# Patient Record
Sex: Female | Born: 2000
Health system: Southern US, Community
[De-identification: ages and names within clinical notes are randomized; demographics above are authoritative.]

---
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
## 2022-01-13 ENCOUNTER — Ambulatory Visit: Payer: 59 | Admitting: Obstetrics & Gynecology

## 2022-01-26 ENCOUNTER — Encounter: Payer: Self-pay | Admitting: Advanced Practice Midwife

## 2022-01-26 ENCOUNTER — Ambulatory Visit (INDEPENDENT_AMBULATORY_CARE_PROVIDER_SITE_OTHER): Payer: 59 | Admitting: Advanced Practice Midwife

## 2022-01-26 ENCOUNTER — Other Ambulatory Visit: Payer: Self-pay

## 2022-01-26 VITALS — BP 112/61 | HR 72 | Ht <= 58 in | Wt 93.0 lb

## 2022-01-26 DIAGNOSIS — N946 Dysmenorrhea, unspecified: Secondary | ICD-10-CM | POA: Diagnosis not present

## 2022-01-26 DIAGNOSIS — Z01419 Encounter for gynecological examination (general) (routine) without abnormal findings: Secondary | ICD-10-CM

## 2022-01-26 DIAGNOSIS — Z3041 Encounter for surveillance of contraceptive pills: Secondary | ICD-10-CM | POA: Diagnosis not present

## 2022-01-26 NOTE — Progress Notes (Signed)
GYNECOLOGY ANNUAL PREVENTATIVE CARE ENCOUNTER NOTE  Subjective:   Linda Mendoza is a 21 y.o. G0P0000 female here for a routine annual gynecologic exam.  Current complaints: dysmenorrhea, well controlled with OCPs and Ibuprofen.   Denies abnormal vaginal bleeding, discharge, pelvic pain, problems with intercourse or other gynecologic concerns.   States does not want to have a pelvic exam or any testing for STIs.  Discussed risks of STIs and that you can have them without symptoms, she declines   Gynecologic History Patient's last menstrual period was 12/20/2021 (exact date). Contraception: OCP (estrogen/progesterone) Last Pap: never  Too young   Obstetric History OB History  Gravida Para Term Preterm AB Living  0 0 0 0 0 0  SAB IAB Ectopic Multiple Live Births  0 0 0 0 0    No past medical history on file.  No past surgical history on file.  Current Outpatient Medications on File Prior to Visit  Medication Sig Dispense Refill   ferrous sulfate 325 (65 FE) MG tablet Take 1 tablet (325 mg total) by mouth daily with breakfast. 28 tablet 12   ibuprofen (ADVIL) 400 MG tablet Take 1 tablet (400 mg total) by mouth every 6 (six) hours as needed. 30 tablet 4   LO LOESTRIN FE 1 MG-10 MCG / 10 MCG tablet Take 1 tablet by mouth daily. 28 tablet 4   No current facility-administered medications on file prior to visit.    No Known Allergies  Social History   Socioeconomic History   Marital status: Single    Spouse name: Not on file   Number of children: Not on file   Years of education: Not on file   Highest education level: 11th grade  Occupational History   Not on file  Tobacco Use   Smoking status: Never   Smokeless tobacco: Never  Vaping Use   Vaping Use: Never used  Substance and Sexual Activity   Alcohol use: Never   Drug use: Never   Sexual activity: Not Currently    Birth control/protection: None  Other Topics Concern   Not on file  Social History Narrative   Is in  8th grade at [employer].   Social Determinants of Health   Financial Resource Strain: Not on file  Food Insecurity: Not on file  Transportation Needs: Not on file  Physical Activity: Not on file  Stress: Not on file  Social Connections: Not on file  Intimate Partner Violence: Not on file    No family history on file.  The following portions of the patient's history were reviewed and updated as appropriate: allergies, current medications, past family history, past medical history, past social history, past surgical history and problem list.  Review of Systems Pertinent items noted in HPI and remainder of comprehensive ROS otherwise negative.   Objective:  BP 112/61    Pulse 72    Ht 4\' 9"  (1.448 m)    Wt 93 lb (42.2 kg)    LMP 12/20/2021 (Exact Date)    BMI 20.13 kg/m  CONSTITUTIONAL: Well-developed, well-nourished female in no acute distress.  HENT:  Normocephalic, atraumatic, External right and left ear normal. Oropharynx is clear and moist EYES: Conjunctivae and EOM are normal. No scleral icterus.  NECK: Normal range of motion, supple, no masses.  Normal thyroid.  SKIN: Skin is warm and dry. No rash noted. Not diaphoretic. No erythema. No pallor. NEUROLOGIC: Alert and oriented to person, place, and time. Normal reflexes, muscle tone coordination. No cranial nerve  deficit noted. PSYCHIATRIC: Normal mood and affect. Normal behavior. Normal judgment and thought content. CARDIOVASCULAR: Normal heart rate noted, regular rhythm RESPIRATORY: Clear to auscultation bilaterally. Effort and breath sounds normal, no problems with respiration noted. BREASTS: Symmetric in size. No masses, skin changes, nipple drainage, or lymphadenopathy. ABDOMEN: Soft, normal bowel sounds, no distention noted.  No tenderness, rebound or guarding.  PELVIC: Deferred. MUSCULOSKELETAL: Normal range of motion. No tenderness.  No cyanosis, clubbing, or edema.  2+ distal pulses.   Assessment:  Annual  gynecologic examination  Oral contraceptive use Dysmenorrhea Knowledge Deficit regarding female anatomy   Plan:  Will reorder OCPs  Long discussion and demonstration with model regarding anatomy of pelvic organs.  Discussed how pelvic exam and pap are done.  Discussed things we look for on pelvic exam.  Discussed STI testing, declines Breast self exam reviewed Routine preventative health maintenance measures emphasized. Please refer to After Visit Summary for other counseling recommendations.

## 2022-02-24 ENCOUNTER — Other Ambulatory Visit: Payer: Self-pay

## 2022-02-24 DIAGNOSIS — N946 Dysmenorrhea, unspecified: Secondary | ICD-10-CM

## 2022-02-24 MED ORDER — FERROUS SULFATE 325 (65 FE) MG PO TABS: 325.0000 mg | ORAL_TABLET | Freq: Every day | ORAL | 12 refills | Status: AC

## 2022-02-24 MED ORDER — IBUPROFEN 400 MG PO TABS: 400.0000 mg | ORAL_TABLET | Freq: Four times a day (QID) | ORAL | 4 refills | Status: AC | PRN

## 2022-02-24 MED ORDER — LO LOESTRIN FE 1 MG-10 MCG / 10 MCG PO TABS: 1.0000 | ORAL_TABLET | Freq: Every day | ORAL | 4 refills | Status: DC

## 2022-02-24 NOTE — Progress Notes (Signed)
Pt called requesting a refill of 400 mg ibuprofen, Lo Loestrin FE and iron supplements. Ibuprofen, Loestrin, and ibuprofen was sent to her pharmacy.  ?Geneal Huebert l Gabreal Worton, CMA  ?

## 2022-08-30 ENCOUNTER — Ambulatory Visit: Payer: 59

## 2022-09-01 ENCOUNTER — Ambulatory Visit: Payer: 59

## 2022-09-02 ENCOUNTER — Ambulatory Visit: Payer: 59

## 2022-12-07 ENCOUNTER — Ambulatory Visit (INDEPENDENT_AMBULATORY_CARE_PROVIDER_SITE_OTHER): Payer: 59

## 2022-12-07 ENCOUNTER — Other Ambulatory Visit (HOSPITAL_COMMUNITY)
Admission: RE | Admit: 2022-12-07 | Discharge: 2022-12-07 | Disposition: A | Discharge status: Home / Self Care | Payer: BC Managed Care – PPO | Source: Ambulatory Visit | Attending: Family Medicine | Admitting: Family Medicine

## 2022-12-07 VITALS — BP 119/67 | HR 79

## 2022-12-07 DIAGNOSIS — Z113 Encounter for screening for infections with a predominantly sexual mode of transmission: Secondary | ICD-10-CM

## 2022-12-07 DIAGNOSIS — Z202 Contact with and (suspected) exposure to infections with a predominantly sexual mode of transmission: Secondary | ICD-10-CM | POA: Diagnosis not present

## 2022-12-07 NOTE — Progress Notes (Signed)
SUBJECTIVE:  21 y.o. female would like to be tested for STD. Patient states she had an unfaithful partner. Denies abnormal vaginal bleeding or significant pelvic pain or fever. No UTI symptoms. Denies history of known exposure to STD.    OBJECTIVE:  She appears well, afebrile.   ASSESSMENT:  Screening for STDs   PLAN:  GC, chlamydia, trichomonas, BVAG, CVAG probe sent to lab.patient requests additional STD bloodwork to be obtained as well. Treatment: To be determined once lab results are received ROV prn if symptoms persist or worsen.   Armandina Stammer RN

## 2022-12-08 ENCOUNTER — Encounter: Payer: Self-pay | Admitting: Advanced Practice Midwife

## 2022-12-08 LAB — HEPATITIS C ANTIBODY: Hep C Virus Ab: NONREACTIVE

## 2022-12-08 LAB — CERVICOVAGINAL ANCILLARY ONLY
Bacterial Vaginitis (gardnerella): POSITIVE — AB
Candida Glabrata: NEGATIVE
Candida Vaginitis: POSITIVE — AB
Chlamydia: NEGATIVE
Comment: NEGATIVE
Comment: NEGATIVE
Comment: NEGATIVE
Comment: NEGATIVE
Comment: NEGATIVE
Comment: NORMAL
Neisseria Gonorrhea: NEGATIVE
Trichomonas: NEGATIVE

## 2022-12-08 LAB — HEPATITIS B SURFACE ANTIGEN: Hepatitis B Surface Ag: NEGATIVE

## 2022-12-08 LAB — HIV ANTIBODY (ROUTINE TESTING W REFLEX): HIV Screen 4th Generation wRfx: NONREACTIVE

## 2022-12-08 LAB — RPR: RPR Ser Ql: NONREACTIVE

## 2022-12-11 MED ORDER — METRONIDAZOLE 500 MG PO TABS: 500.0000 mg | ORAL_TABLET | Freq: Two times a day (BID) | ORAL | 0 refills | Status: AC

## 2022-12-11 MED ORDER — FLUCONAZOLE 150 MG PO TABS: 150.0000 mg | ORAL_TABLET | Freq: Once | ORAL | 0 refills | Status: AC

## 2022-12-11 NOTE — Addendum Note (Signed)
Addended by: Levie Heritage on: 12/11/2022 11:05 AM   Modules accepted: Orders

## 2022-12-28 ENCOUNTER — Ambulatory Visit: Payer: Self-pay | Admitting: Advanced Practice Midwife

## 2023-01-11 ENCOUNTER — Ambulatory Visit: Payer: Self-pay | Admitting: Advanced Practice Midwife

## 2023-01-24 ENCOUNTER — Other Ambulatory Visit: Payer: Self-pay

## 2023-01-24 DIAGNOSIS — N946 Dysmenorrhea, unspecified: Secondary | ICD-10-CM

## 2023-01-24 MED ORDER — LO LOESTRIN FE 1 MG-10 MCG / 10 MCG PO TABS: 1.0000 | ORAL_TABLET | Freq: Every day | ORAL | 0 refills | Status: DC

## 2023-02-15 ENCOUNTER — Encounter: Payer: Self-pay | Admitting: Advanced Practice Midwife

## 2023-02-15 ENCOUNTER — Ambulatory Visit (INDEPENDENT_AMBULATORY_CARE_PROVIDER_SITE_OTHER): Payer: BC Managed Care – PPO | Admitting: Advanced Practice Midwife

## 2023-02-15 VITALS — BP 100/61 | HR 74 | Ht <= 58 in | Wt 97.0 lb

## 2023-02-15 DIAGNOSIS — Z3041 Encounter for surveillance of contraceptive pills: Secondary | ICD-10-CM | POA: Insufficient documentation

## 2023-02-15 DIAGNOSIS — Z01419 Encounter for gynecological examination (general) (routine) without abnormal findings: Secondary | ICD-10-CM

## 2023-02-15 DIAGNOSIS — N946 Dysmenorrhea, unspecified: Secondary | ICD-10-CM | POA: Diagnosis not present

## 2023-02-15 MED ORDER — LO LOESTRIN FE 1 MG-10 MCG / 10 MCG PO TABS: 1.0000 | ORAL_TABLET | Freq: Every day | ORAL | 11 refills | Status: DC

## 2023-02-15 NOTE — Progress Notes (Signed)
GYNECOLOGY ANNUAL PREVENTATIVE CARE ENCOUNTER NOTE  Subjective:   Linda Mendoza is a 22 y.o. G0P0000 female here for a routine annual gynecologic exam.  Current complaints: dysmenorrhea, better on OCPs  Wants to continue OCPs.  .   Denies abnormal vaginal bleeding, discharge, pelvic pain, problems with intercourse or other gynecologic concerns.   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 Contraception: OCP (estrogen/progesterone) Last Pap: too young.   Obstetric History OB History  Gravida Para Term Preterm AB Living  0 0 0 0 0 0  SAB IAB Ectopic Multiple Live Births  0 0 0 0 0    No past medical history on file.  No past surgical history on file.  Current Outpatient Medications on File Prior to Visit  Medication Sig Dispense Refill   fluconazole (DIFLUCAN) 150 MG tablet Take 150 mg by mouth once.     ferrous sulfate 325 (65 FE) MG tablet Take 1 tablet (325 mg total) by mouth daily with breakfast. 28 tablet 12   ibuprofen (ADVIL) 400 MG tablet Take 1 tablet (400 mg total) by mouth every 6 (six) hours as needed. 30 tablet 4   metroNIDAZOLE (FLAGYL) 500 MG tablet Take 1 tablet (500 mg total) by mouth 2 (two) times daily. 14 tablet 0   No current facility-administered medications on file prior to visit.    No Known Allergies  Social History   Socioeconomic History   Marital status: Single    Spouse name: Not on file   Number of children: Not on file   Years of education: Not on file   Highest education level: 11th grade  Occupational History   Not on file  Tobacco Use   Smoking status: Never   Smokeless tobacco: Never  Vaping Use   Vaping Use: Never used  Substance and Sexual Activity   Alcohol use: Never   Drug use: Never   Sexual activity: Not Currently    Birth control/protection: None  Other Topics Concern   Not on file  Social History  Narrative   Is in 8th grade at [employer].   Social Determinants of Health   Financial Resource Strain: Not on file  Food Insecurity: Not on file  Transportation Needs: Not on file  Physical Activity: Not on file  Stress: Not on file  Social Connections: Not on file  Intimate Partner Violence: Not on file    No family history on file.  The following portions of the patient's history were reviewed and updated as appropriate: allergies, current medications, past family history, past medical history, past social history, past surgical history and problem list.  Review of Systems Pertinent items noted in HPI and remainder of comprehensive ROS otherwise negative.   Objective:  BP 100/61   Pulse 74   Ht '4\' 10"'$  (1.473 m)   Wt 97 lb (44 kg)   LMP 01/26/2023   BMI 20.27 kg/m  CONSTITUTIONAL: Well-developed, well-nourished female in no acute distress.  HENT:  Normocephalic, atraumatic, External right and left ear normal. Oropharynx is clear and moist EYES: Conjunctivae and EOM are normal. No scleral icterus.  NECK: Normal range of motion, supple, no masses.  Normal thyroid.  SKIN: Skin is warm and dry. No rash noted. Not diaphoretic. No erythema. No pallor. NEUROLOGIC: Alert and oriented to person, place, and time. Normal reflexes, muscle tone coordination. No cranial nerve deficit  noted. PSYCHIATRIC: Normal mood and affect. Normal behavior. Normal judgment and thought content. CARDIOVASCULAR: Normal heart rate noted, regular rhythm RESPIRATORY: Clear to auscultation bilaterally. Effort and breath sounds normal, no problems with respiration noted. BREASTS: Symmetric in size. No masses, skin changes, nipple drainage, or lymphadenopathy. ABDOMEN: Soft, normal bowel sounds, no distention noted.  No tenderness, rebound or guarding.  PELVIC: deferred per pt preference MUSCULOSKELETAL: Normal range of motion. No tenderness.  No cyanosis, clubbing, or edema.  2+ distal  pulses.   Assessment:  Annual gynecologic examination Oral contraceptive use Dysmenorrhea   Plan:  Rx Lo Loestrin OCPs x 1 year Reviewed ACHES/signs of VTE Ibuprofen for dysmenorrhea Pap at age 17 Broke up with prior partner, declines STD testing for now Routine preventative health maintenance measures emphasized. Please refer to After Visit Summary for other counseling recommendations.

## 2023-04-01 ENCOUNTER — Encounter: Payer: Self-pay | Admitting: Advanced Practice Midwife

## 2023-04-01 ENCOUNTER — Other Ambulatory Visit: Payer: Self-pay

## 2023-04-01 DIAGNOSIS — N946 Dysmenorrhea, unspecified: Secondary | ICD-10-CM

## 2023-04-01 MED ORDER — LO LOESTRIN FE 1 MG-10 MCG / 10 MCG PO TABS: 1.0000 | ORAL_TABLET | Freq: Every day | ORAL | 11 refills | Status: AC

## 2023-04-26 ENCOUNTER — Other Ambulatory Visit (HOSPITAL_COMMUNITY)
Admission: RE | Admit: 2023-04-26 | Discharge: 2023-04-26 | Disposition: A | Discharge status: Home / Self Care | Payer: BC Managed Care – PPO | Source: Ambulatory Visit | Attending: Student | Admitting: Student

## 2023-04-26 ENCOUNTER — Ambulatory Visit (INDEPENDENT_AMBULATORY_CARE_PROVIDER_SITE_OTHER): Payer: BC Managed Care – PPO

## 2023-04-26 VITALS — BP 96/50 | HR 63 | Ht 59.0 in | Wt 98.0 lb

## 2023-04-26 DIAGNOSIS — Z113 Encounter for screening for infections with a predominantly sexual mode of transmission: Secondary | ICD-10-CM

## 2023-04-26 NOTE — Progress Notes (Signed)
Patient request STD testing.  Patient has not had a known exposure to any STD and not having any symptoms.  Patient denies offer to do bloodwork STD testing. Armandina Stammer RN

## 2023-04-27 LAB — CERVICOVAGINAL ANCILLARY ONLY
Chlamydia: NEGATIVE
Comment: NEGATIVE
Comment: NEGATIVE
Comment: NORMAL
Neisseria Gonorrhea: NEGATIVE
Trichomonas: NEGATIVE

## 2023-07-20 ENCOUNTER — Encounter: Payer: Self-pay | Admitting: Obstetrics & Gynecology

## 2023-07-20 ENCOUNTER — Ambulatory Visit (INDEPENDENT_AMBULATORY_CARE_PROVIDER_SITE_OTHER): Payer: BC Managed Care – PPO | Admitting: Obstetrics & Gynecology

## 2023-07-20 VITALS — BP 96/61 | HR 75 | Ht <= 58 in | Wt 100.0 lb

## 2023-07-20 DIAGNOSIS — Z3009 Encounter for other general counseling and advice on contraception: Secondary | ICD-10-CM | POA: Diagnosis not present

## 2023-07-20 DIAGNOSIS — Z01419 Encounter for gynecological examination (general) (routine) without abnormal findings: Secondary | ICD-10-CM

## 2023-07-20 MED ORDER — MISOPROSTOL 200 MCG PO TABS: ORAL_TABLET | ORAL | 0 refills | Status: DC

## 2023-07-20 NOTE — Progress Notes (Signed)
Patient presents for annual exam. Patient on birth control- LOLoestrin Armandina Stammer RN

## 2023-07-20 NOTE — Patient Instructions (Signed)

## 2023-07-20 NOTE — Progress Notes (Signed)
Subjective:     Linda Mendoza is a 22 y.o. female here for a routine exam.  Current complaints: annual GYN exam and refill of contraception.       Gynecologic History Patient's last menstrual period was 07/10/2023. Contraception: OCP (estrogen/progesterone) Last Pap: n/a.  Last mammogram: n/a.   Obstetric History OB History  Gravida Para Term Preterm AB Living  0 0 0 0 0 0  SAB IAB Ectopic Multiple Live Births  0 0 0 0 0     The following portions of the patient's history were reviewed and updated as appropriate: allergies, current medications, past family history, past medical history, past social history, past surgical history, and problem list.  Review of Systems Pertinent items are noted in HPI.    Objective:  BP 96/61   Pulse 75   Ht 4\' 10"  (1.473 m)   Wt 100 lb (45.4 kg)   LMP 07/10/2023   BMI 20.90 kg/m   General Appearance:    Alert, cooperative, no distress, appears stated age  Head:    Normocephalic, without obvious abnormality, atraumatic  Eyes:    conjunctiva/corneas clear, EOM's intact, both eyes  Ears:    Normal external ear canals, both ears  Nose:   Nares normal, septum midline, mucosa normal, no drainage    or sinus tenderness  Throat:   Lips, mucosa, and tongue normal; teeth and gums normal  Neck:   Supple, symmetrical, trachea midline, no adenopathy;    thyroid:  no enlargement/tenderness/nodules  Back:     Symmetric, no curvature, ROM normal, no CVA tenderness  Lungs:     respirations unlabored  Chest Wall:    No tenderness or deformity   Heart:    Regular rate and rhythm  Breast Exam:    No tenderness, masses, or nipple abnormality  Abdomen:     Soft, non-tender, bowel sounds active all four quadrants,    no masses, no organomegaly  Genitalia:    Normal female without lesion, discharge or tenderness     Extremities:   Extremities normal, atraumatic, no cyanosis or edema  Pulses:   2+ and symmetric all extremities  Skin:   Skin color, texture,  turgor normal, no rashes or lesions     Assessment:    Healthy female exam.  Reviewed all forms of birth control options available including abstinence; over the counter/barrier methods; hormonal contraceptive medication including pill, patch, ring, injection,contraceptive implant; hormonal and nonhormonal IUDs; permanent sterilization options including vasectomy and the various tubal sterilization modalities. Risks and benefits reviewed.  Questions were answered.  Information was given to patient to review.  Pt will f/u for IUD after taking cytotec.    Plan:   Diagnoses and all orders for this visit:  Well female exam with routine gynecological exam  Encounter for counseling regarding contraception -     Discontinue: misoprostol (CYTOTEC) 200 MCG tablet; Place two tablets in the vagina the night prior to your next clinic appointment   F/u in 2 weeks for IUD after Cytotec   Xiao Graul L. Harraway-Smith, M.D., [facility]

## 2023-08-01 ENCOUNTER — Encounter: Payer: Self-pay | Admitting: Obstetrics & Gynecology

## 2023-08-01 ENCOUNTER — Other Ambulatory Visit: Payer: Self-pay

## 2023-08-01 DIAGNOSIS — Z3009 Encounter for other general counseling and advice on contraception: Secondary | ICD-10-CM

## 2023-08-01 MED ORDER — MISOPROSTOL 200 MCG PO TABS: ORAL_TABLET | ORAL | 0 refills | Status: AC

## 2023-08-01 NOTE — Progress Notes (Signed)
Cytotec Rx sent to [facility] on [address] was canceled.  Jaclyn Carew l Rodrigues Urbanek, CMA

## 2023-08-03 ENCOUNTER — Ambulatory Visit (INDEPENDENT_AMBULATORY_CARE_PROVIDER_SITE_OTHER): Payer: BC Managed Care – PPO | Admitting: Obstetrics & Gynecology

## 2023-08-03 ENCOUNTER — Encounter: Payer: Self-pay | Admitting: Obstetrics & Gynecology

## 2023-08-03 VITALS — BP 111/74 | HR 75 | Wt 95.1 lb

## 2023-08-03 DIAGNOSIS — Z30430 Encounter for insertion of intrauterine contraceptive device: Secondary | ICD-10-CM

## 2023-08-03 LAB — POCT URINE PREGNANCY: Preg Test, Ur: NEGATIVE

## 2023-08-03 MED ORDER — LEVONORGESTREL 20 MCG/DAY IU IUD
1.0000 | INTRAUTERINE_SYSTEM | Freq: Once | INTRAUTERINE | Status: AC
  Administered 2023-08-03: 1 via INTRAUTERINE

## 2023-08-03 NOTE — Progress Notes (Signed)
GYNECOLOGY CLINIC PROCEDURE NOTE  Linda Mendoza is a 22 y.o. G0P0000 here for Mirena IUD insertion. No GYN concerns. Pt did not take the Cytotec that was prescribed. Still wants to proceed.   IUD Insertion Procedure Note Patient identified, informed consent performed.  Discussed risks of irregular bleeding, cramping, infection, malpositioning or misplacement of the IUD outside the uterus which may require further procedures. Time out was performed.  Urine pregnancy test negative.  Speculum placed in the vagina.  Cervix visualized.  Cleaned with Betadine x 2.  Grasped anteriorly with a single tooth tenaculum.  Uterus sounded to 3 cm.  Mirena IUD placed per manufacturer's recommendations.  Strings trimmed to 3 cm. Tenaculum was removed, good hemostasis noted.  Patient tolerated procedure well.   Patient was given post-procedure instructions.  Patient was asked to follow up in 4 weeks for IUD check.  [name], M.D., [facility]

## 2023-08-24 ENCOUNTER — Encounter: Payer: Self-pay | Admitting: Obstetrics & Gynecology

## 2023-08-29 ENCOUNTER — Encounter: Payer: Self-pay | Admitting: Advanced Practice Midwife

## 2023-08-31 ENCOUNTER — Ambulatory Visit: Payer: BC Managed Care – PPO | Admitting: Obstetrics & Gynecology

## 2023-09-21 ENCOUNTER — Ambulatory Visit: Payer: BC Managed Care – PPO | Admitting: Obstetrics & Gynecology

## 2023-10-12 ENCOUNTER — Ambulatory Visit: Payer: BC Managed Care – PPO | Admitting: Obstetrics & Gynecology

## 2024-01-04 ENCOUNTER — Ambulatory Visit: Payer: BC Managed Care – PPO | Admitting: Obstetrics & Gynecology

## 2024-01-18 ENCOUNTER — Ambulatory Visit: Payer: 59 | Admitting: Obstetrics & Gynecology

## 2024-01-18 ENCOUNTER — Encounter: Payer: Self-pay | Admitting: Obstetrics & Gynecology

## 2024-01-18 VITALS — BP 102/71 | HR 68 | Wt 100.1 lb

## 2024-01-18 DIAGNOSIS — Z3041 Encounter for surveillance of contraceptive pills: Secondary | ICD-10-CM

## 2024-01-18 DIAGNOSIS — Z3202 Encounter for pregnancy test, result negative: Secondary | ICD-10-CM | POA: Diagnosis not present

## 2024-01-18 DIAGNOSIS — Z30431 Encounter for routine checking of intrauterine contraceptive device: Secondary | ICD-10-CM

## 2024-01-18 DIAGNOSIS — Z3009 Encounter for other general counseling and advice on contraception: Secondary | ICD-10-CM

## 2024-01-18 LAB — POCT URINE PREGNANCY: Preg Test, Ur: NEGATIVE

## 2024-01-18 NOTE — Progress Notes (Signed)
  GYNECOLOGY OFFICE ENCOUNTER NOTE  History:  23 y.o. G0P0000 here today for today for IUD string check; Mirena  IUD was placed  08/03/2023. No complaints about the IUD, no concerning side effects. Pt also wanted pregnancy test.     The following portions of the patient's history were reviewed and updated as appropriate: allergies, current medications, past family history, past medical history, past social history, past surgical history and problem list.  Review of Systems:  Pertinent items are noted in HPI.   Objective:  Physical Exam Blood pressure 102/71, pulse 68, weight 100 lb 1.9 oz (45.4 kg), last menstrual period 11/20/2023. CONSTITUTIONAL: Well-developed, well-nourished female in no acute distress.  HENT:  Normocephalic, atraumatic. External right and left ear normal. Oropharynx is clear and moist EYES: Conjunctivae and EOM are normal. Pupils are equal, round, and reactive to light. No scleral icterus.  NECK: Normal range of motion, supple, no masses CARDIOVASCULAR: Normal heart rate noted RESPIRATORY: Effort and breath sounds normal, no problems with respiration noted ABDOMEN: Soft, no distention noted.   PELVIC: Normal appearing external genitalia; normal appearing vaginal mucosa and cervix.  IUD strings visualized, about 2 cm in length outside cervix.   UPT: neg Assessment & Plan:  Patient to keep IUD in place for up to seven years; can come in for removal if she desires pregnancy earlier or for any concerning side effects. Reviewed safer sex and risk factors for STIs Given condoms.  F/u prn     Roschelle Calandra L. Harraway-Smith, MD, FACOG Obstetrician & Gynecologist, [facility] for [employer], [facility]

## 2024-02-28 ENCOUNTER — Ambulatory Visit

## 2024-02-28 ENCOUNTER — Other Ambulatory Visit (HOSPITAL_COMMUNITY)
Admission: RE | Admit: 2024-02-28 | Discharge: 2024-02-28 | Disposition: A | Discharge status: Home / Self Care | Source: Ambulatory Visit

## 2024-02-28 VITALS — BP 98/67 | HR 84 | Ht <= 58 in | Wt 101.0 lb

## 2024-02-28 DIAGNOSIS — Z113 Encounter for screening for infections with a predominantly sexual mode of transmission: Secondary | ICD-10-CM

## 2024-02-28 NOTE — Progress Notes (Signed)
   GYNECOLOGY PROBLEM OFFICE VISIT NOTE  History:  Linda Mendoza is a 23 y.o. G0P0000 here today for labial bump. She states she noted a lump about one week ago.  She thought it was an ingrown hair and popped it. She reports blood came out, but did not resolve. She states that when she urinates the area hurts and notes that it is also sensitive to touch.   Patient is currently sexually active and reports condom usage. She reports last sexual encounter two weeks ago with new partner.   Patient denies any abnormal vaginal discharge, bleeding, pelvic pain or other concerns.   No past medical history on file.  No past surgical history on file.  The following portions of the patient's history were reviewed and updated as appropriate: allergies, current medications, past family history, past medical history, past social history, past surgical history and problem list.   Health Maintenance:  No pap or mammogram on file d/t age.   Review of Systems:  Genito-Urinary ROS: no dysuria, trouble voiding, or hematuria Reports yellow milky discharge. No odor or irritation. Amenorrhea d/t IUD.  Gastrointestinal ROS: negative Objective:  Vitals: BP 98/67   Pulse 84   Ht 4\' 9"  (1.448 m)   Wt 101 lb (45.8 kg)   LMP  (LMP Unknown)   BMI 21.86 kg/m   Physical Exam: Physical Exam Constitutional:      Appearance: Normal appearance.  Genitourinary:     Genitourinary Comments: Lesion: White with redness outlining. Appears slightly ulcerated. HSV culture collected (x2) with some increased redness, but no discharge or bleeding.   CV collected blindly.      Right Labia: tenderness and lesions.      HENT:     Head: Normocephalic and atraumatic.  Eyes:     Conjunctiva/sclera: Conjunctivae normal.  Cardiovascular:     Rate and Rhythm: Normal rate.  Musculoskeletal:        General: Normal range of motion.     Cervical back: Normal range of motion.  Neurological:     Mental Status: She is alert  and oriented to person, place, and time.  Skin:    General: Skin is warm and dry.  Psychiatric:        Mood and Affect: Mood normal.        Behavior: Behavior normal.  Vitals reviewed. Exam conducted with a chaperone present Steward Drone, Charity fundraiser).      Labs and Imaging: No results found.  Assessment & Plan:  23 year old Labial Bump STD Testing  -HSV culture collected. -Informed that findings are not definitive for HSV so will await for results to treat if necessary. -STD testing collected. -Discussed applying warm compresses and topical hydrocortisone cream if HSV is negative.  -Also encouraged to consider removing hair (via plucking) from area to allow for visualization and healing.  -Await results    Total face-to-face time with patient: 15 minutes   Gerrit Heck, CNM 02/28/2024 2:53 PM

## 2024-03-01 LAB — CERVICOVAGINAL ANCILLARY ONLY
Chlamydia: NEGATIVE
Comment: NEGATIVE
Comment: NEGATIVE
Comment: NORMAL
Neisseria Gonorrhea: NEGATIVE
Trichomonas: NEGATIVE

## 2024-03-02 ENCOUNTER — Telehealth: Payer: Self-pay

## 2024-03-02 DIAGNOSIS — A609 Anogenital herpesviral infection, unspecified: Secondary | ICD-10-CM

## 2024-03-02 LAB — HERPES SIMPLEX VIRUS CULTURE

## 2024-03-02 MED ORDER — VALACYCLOVIR HCL 1 G PO TABS: 1000.0000 mg | ORAL_TABLET | Freq: Two times a day (BID) | ORAL | 3 refills | Status: DC

## 2024-03-02 NOTE — Telephone Encounter (Signed)
-----   Message from Neale Burly sent at 03/02/2024  9:57 AM EDT ----- Regarding: Call Patient called and said that she wanted to speak with a doctor but would not state her concern.  Best contact number is [phone]

## 2024-03-02 NOTE — Telephone Encounter (Signed)
 Caden Fatica September 06, 2001 [id number]  Patient Location: Home  Patient called and verified her identity via birth date and last 4 of her SSN.  Patient agreeable to results via phone and was informed of positive HSV findings. Informed that virus is type   Patient appropriately upset.  Support given.  Patient informed that Rx for Valtrex sent in and patient to take twice daily for 10 days.  Then if desiring maintenance, she can take daily.  Patient questions if she can have subsequent blood work completed and informed that this is an option if she desired.  Patient instructed to report to [facility] office, on Monday, to receive paperwork for labs. Patient verbalizes understanding and without further questions.   Cherre Robins MSN, CNM Advanced Practice Provider, Center for [facility]   Provider Location: [facility]  **This visit was completed, in its entirety, via telehealth communications.  I personally spent >/=4 minutes on the telephone providing recommendations, education, and guidance.**

## 2024-03-02 NOTE — Telephone Encounter (Signed)
 Called patient back.  Very upset regarding lab results.  Rx sent in per Dr. Briscoe Deutscher.  Patient would like to speak with provider.  Linda Mendoza [employer]

## 2024-03-12 ENCOUNTER — Telehealth: Payer: Self-pay

## 2024-03-12 NOTE — Telephone Encounter (Signed)
 Patient called regarding Valtrex refills.  Patient has 3 remaining at the pharmacy.  Patient is aware.  Danna Hefty [employer]

## 2024-03-12 NOTE — Telephone Encounter (Signed)
-----   Message from Neale Burly sent at 03/12/2024  2:09 PM EDT ----- Regarding: Medication Refill Called saying she needs a refill on valACYclovir (VALTREX) 1000 MG tablet [[id number]]  [facility] [address] [place], [place] - [address]  [address] [place], [place] [address]

## 2024-05-10 ENCOUNTER — Other Ambulatory Visit: Payer: Self-pay

## 2024-05-10 DIAGNOSIS — A609 Anogenital herpesviral infection, unspecified: Secondary | ICD-10-CM

## 2024-05-10 MED ORDER — VALACYCLOVIR HCL 1 G PO TABS: 1000.0000 mg | ORAL_TABLET | Freq: Two times a day (BID) | ORAL | 3 refills | Status: DC

## 2024-07-23 ENCOUNTER — Other Ambulatory Visit: Payer: Self-pay

## 2024-07-23 DIAGNOSIS — A609 Anogenital herpesviral infection, unspecified: Secondary | ICD-10-CM

## 2024-07-23 MED ORDER — VALACYCLOVIR HCL 500 MG PO TABS: 500.0000 mg | ORAL_TABLET | Freq: Every day | ORAL | 3 refills | Status: AC

## 2024-12-30 ENCOUNTER — Other Ambulatory Visit: Payer: Self-pay | Admitting: Obstetrics and Gynecology

## 2024-12-30 DIAGNOSIS — A609 Anogenital herpesviral infection, unspecified: Secondary | ICD-10-CM
